# Patient Record
Sex: Male | Born: 2000 | Race: White | Hispanic: No | Marital: Single
Health system: Southern US, Community
[De-identification: ages and names within clinical notes are randomized; demographics above are authoritative.]

---

## 2019-12-19 ENCOUNTER — Ambulatory Visit: Payer: Self-pay | Attending: Internal Medicine

## 2019-12-27 ENCOUNTER — Ambulatory Visit: Payer: Self-pay | Attending: Internal Medicine

## 2019-12-27 DIAGNOSIS — Z23 Encounter for immunization: Secondary | ICD-10-CM

## 2019-12-27 NOTE — Progress Notes (Signed)
   Covid-19 Vaccination Clinic  Name:  Henry Rodgers    MRN: [id number] DOB: 29-Dec-2000  12/27/2019  [name] was observed post Covid-19 immunization for 15 minutes without incident. He was provided with Vaccine Information Sheet and instruction to access the V-Safe system.   [name] was instructed to call 911 with any severe reactions post vaccine: Marland Kitchen Difficulty breathing  . Swelling of face and throat  . A fast heartbeat  . A bad rash all over body  . Dizziness and weakness   Immunizations Administered    Name Date Dose VIS Date Route   Pfizer COVID-19 Vaccine 12/27/2019  5:42 PM 0.3 mL 09/10/2019 Intramuscular   Manufacturer: [employer], [employer]   Lot: [id number]   NDC: [address]-0

## 2020-01-17 ENCOUNTER — Ambulatory Visit: Payer: Self-pay | Attending: Internal Medicine

## 2020-01-17 DIAGNOSIS — Z23 Encounter for immunization: Secondary | ICD-10-CM

## 2020-01-17 NOTE — Progress Notes (Signed)
   Covid-19 Vaccination Clinic  Name:  Henry Rodgers    MRN: [id number] DOB: October 14, 2000  01/17/2020  Mr. Henry Rodgers was observed post Covid-19 immunization for 30 minutes based on pre-vaccination screening without incident. He was provided with Vaccine Information Sheet and instruction to access the V-Safe system.   Mr. Henry Rodgers was instructed to call 911 with any severe reactions post vaccine: Marland Kitchen Difficulty breathing  . Swelling of face and throat  . A fast heartbeat  . A bad rash all over body  . Dizziness and weakness   Immunizations Administered    Name Date Dose VIS Date Route   Pfizer COVID-19 Vaccine 01/17/2020  5:18 PM 0.3 mL 11/24/2018 Intramuscular   Manufacturer: [employer], [employer]   Lot: [id number]   NDC: [address]-7

## 2021-02-14 ENCOUNTER — Other Ambulatory Visit: Payer: Self-pay

## 2021-02-14 ENCOUNTER — Emergency Department
Admission: EM | Admit: 2021-02-14 | Discharge: 2021-02-14 | Disposition: A | Discharge status: Home / Self Care | Payer: BLUE CROSS/BLUE SHIELD | Attending: Emergency Medicine | Admitting: Emergency Medicine

## 2021-02-14 ENCOUNTER — Encounter: Payer: Self-pay | Admitting: Emergency Medicine

## 2021-02-14 ENCOUNTER — Emergency Department: Payer: BLUE CROSS/BLUE SHIELD

## 2021-02-14 DIAGNOSIS — F10129 Alcohol abuse with intoxication, unspecified: Secondary | ICD-10-CM | POA: Diagnosis not present

## 2021-02-14 DIAGNOSIS — R4182 Altered mental status, unspecified: Secondary | ICD-10-CM | POA: Diagnosis not present

## 2021-02-14 DIAGNOSIS — Y908 Blood alcohol level of 240 mg/100 ml or more: Secondary | ICD-10-CM | POA: Insufficient documentation

## 2021-02-14 DIAGNOSIS — S80211A Abrasion, right knee, initial encounter: Secondary | ICD-10-CM | POA: Insufficient documentation

## 2021-02-14 DIAGNOSIS — Z91010 Allergy to peanuts: Secondary | ICD-10-CM | POA: Insufficient documentation

## 2021-02-14 DIAGNOSIS — Y92168 Other place in school dormitory as the place of occurrence of the external cause: Secondary | ICD-10-CM | POA: Insufficient documentation

## 2021-02-14 DIAGNOSIS — S6992XA Unspecified injury of left wrist, hand and finger(s), initial encounter: Secondary | ICD-10-CM | POA: Diagnosis present

## 2021-02-14 DIAGNOSIS — S60512A Abrasion of left hand, initial encounter: Secondary | ICD-10-CM | POA: Diagnosis not present

## 2021-02-14 DIAGNOSIS — F10920 Alcohol use, unspecified with intoxication, uncomplicated: Secondary | ICD-10-CM

## 2021-02-14 LAB — CBC WITH DIFFERENTIAL/PLATELET
Abs Immature Granulocytes: 0.01 10*3/uL (ref 0.00–0.07)
Basophils Absolute: 0.1 10*3/uL (ref 0.0–0.1)
Basophils Relative: 1 %
Eosinophils Absolute: 0.3 10*3/uL (ref 0.0–0.5)
Eosinophils Relative: 4 %
HCT: 45.6 % (ref 39.0–52.0)
Hemoglobin: 15.6 g/dL (ref 13.0–17.0)
Immature Granulocytes: 0 %
Lymphocytes Relative: 34 %
Lymphs Abs: 2.8 10*3/uL (ref 0.7–4.0)
MCH: 30.1 pg (ref 26.0–34.0)
MCHC: 34.2 g/dL (ref 30.0–36.0)
MCV: 88 fL (ref 80.0–100.0)
Monocytes Absolute: 0.6 10*3/uL (ref 0.1–1.0)
Monocytes Relative: 7 %
Neutro Abs: 4.4 10*3/uL (ref 1.7–7.7)
Neutrophils Relative %: 54 %
Platelets: 276 10*3/uL (ref 150–400)
RBC: 5.18 MIL/uL (ref 4.22–5.81)
RDW: 12.3 % (ref 11.5–15.5)
WBC: 8.1 10*3/uL (ref 4.0–10.5)
nRBC: 0 % (ref 0.0–0.2)

## 2021-02-14 LAB — URINALYSIS, COMPLETE (UACMP) WITH MICROSCOPIC
Bacteria, UA: NONE SEEN
Bilirubin Urine: NEGATIVE
Glucose, UA: NEGATIVE mg/dL
Hgb urine dipstick: NEGATIVE
Ketones, ur: NEGATIVE mg/dL
Leukocytes,Ua: NEGATIVE
Nitrite: NEGATIVE
Protein, ur: NEGATIVE mg/dL
Specific Gravity, Urine: 1.002 — ABNORMAL LOW (ref 1.005–1.030)
Squamous Epithelial / HPF: NONE SEEN (ref 0–5)
pH: 7 (ref 5.0–8.0)

## 2021-02-14 LAB — COMPREHENSIVE METABOLIC PANEL
ALT: 13 U/L (ref 0–44)
AST: 24 U/L (ref 15–41)
Albumin: 4.6 g/dL (ref 3.5–5.0)
Alkaline Phosphatase: 54 U/L (ref 38–126)
Anion gap: 13 (ref 5–15)
BUN: 12 mg/dL (ref 6–20)
CO2: 22 mmol/L (ref 22–32)
Calcium: 9 mg/dL (ref 8.9–10.3)
Chloride: 106 mmol/L (ref 98–111)
Creatinine, Ser: 0.85 mg/dL (ref 0.61–1.24)
GFR, Estimated: >60 mL/min (ref >60)
Glucose, Bld: 125 mg/dL — ABNORMAL HIGH (ref 70–99)
Potassium: 3.8 mmol/L (ref 3.5–5.1)
Sodium: 141 mmol/L (ref 135–145)
Total Bilirubin: 0.6 mg/dL (ref 0.3–1.2)
Total Protein: 8.2 g/dL — ABNORMAL HIGH (ref 6.5–8.1)

## 2021-02-14 LAB — URINE DRUG SCREEN, QUALITATIVE (ARMC ONLY)
Amphetamines, Ur Screen: NOT DETECTED
Barbiturates, Ur Screen: NOT DETECTED
Benzodiazepine, Ur Scrn: NOT DETECTED
Cannabinoid 50 Ng, Ur ~~LOC~~: NOT DETECTED
Cocaine Metabolite,Ur ~~LOC~~: NOT DETECTED
MDMA (Ecstasy)Ur Screen: NOT DETECTED
Methadone Scn, Ur: NOT DETECTED
Opiate, Ur Screen: NOT DETECTED
Phencyclidine (PCP) Ur S: NOT DETECTED
Tricyclic, Ur Screen: NOT DETECTED

## 2021-02-14 LAB — ETHANOL: Alcohol, Ethyl (B): 324 mg/dL (ref <10)

## 2021-02-14 MED ORDER — SODIUM CHLORIDE 0.9 % IV BOLUS
1000.0000 mL | Freq: Once | INTRAVENOUS | Status: AC
  Administered 2021-02-14: 1000 mL via INTRAVENOUS

## 2021-02-14 NOTE — ED Notes (Signed)
Pt presents with abrasions on R knee, L hand, and R elbow. Pt unaware of how he got them.
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
## 2021-02-14 NOTE — ED Notes (Signed)
NAD noted at time of D/C. Pt ambulatory with steady gait to the lobby to wait for his uber. Pt denies comments/concerns regarding D/C instructions. Per EDP pt okay to go home in [place]. Pt removed all monitoring equipment and IV prior to this RN arrival to bedside with D/C papers. Verbal consent for D/C obtained at this time.

## 2021-02-14 NOTE — ED Notes (Signed)
Family updated as to patient's status.
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
## 2021-02-14 NOTE — ED Notes (Signed)
Pt awakens easily with mild stimulation. Pt calling friends for a ride on personal cell phone at this time.

## 2021-02-14 NOTE — ED Notes (Signed)
Pt's mother updated on pt condition. Pt given phone to speak to mother per pt request.

## 2021-02-14 NOTE — ED Notes (Signed)
This RN to bedside, introduced self to patient. Pt visualized in NAD, in bed and speaking on personal cell phone. Call bell within reach of patient at this time.

## 2021-06-11 ENCOUNTER — Emergency Department
Admission: EM | Admit: 2021-06-11 | Discharge: 2021-06-11 | Disposition: A | Discharge status: Home / Self Care | Payer: BLUE CROSS/BLUE SHIELD | Attending: Student in an Organized Health Care Education/Training Program | Admitting: Student in an Organized Health Care Education/Training Program

## 2021-06-11 ENCOUNTER — Emergency Department: Payer: BLUE CROSS/BLUE SHIELD

## 2021-06-11 ENCOUNTER — Other Ambulatory Visit: Payer: Self-pay

## 2021-06-11 DIAGNOSIS — N50812 Left testicular pain: Secondary | ICD-10-CM | POA: Diagnosis not present

## 2021-06-11 DIAGNOSIS — R52 Pain, unspecified: Secondary | ICD-10-CM

## 2021-06-11 DIAGNOSIS — Z91010 Allergy to peanuts: Secondary | ICD-10-CM | POA: Insufficient documentation

## 2021-06-11 DIAGNOSIS — R1032 Left lower quadrant pain: Secondary | ICD-10-CM | POA: Diagnosis not present

## 2021-06-11 LAB — URINALYSIS, COMPLETE (UACMP) WITH MICROSCOPIC
Bacteria, UA: NONE SEEN
Bilirubin Urine: NEGATIVE
Glucose, UA: NEGATIVE mg/dL
Hgb urine dipstick: NEGATIVE
Ketones, ur: NEGATIVE mg/dL
Leukocytes,Ua: NEGATIVE
Nitrite: NEGATIVE
Protein, ur: NEGATIVE mg/dL
Specific Gravity, Urine: 1.015 (ref 1.005–1.030)
pH: 5 (ref 5.0–8.0)

## 2021-06-11 LAB — CBC
HCT: 41.3 % (ref 39.0–52.0)
Hemoglobin: 14.2 g/dL (ref 13.0–17.0)
MCH: 31.2 pg (ref 26.0–34.0)
MCHC: 34.4 g/dL (ref 30.0–36.0)
MCV: 90.8 fL (ref 80.0–100.0)
Platelets: 258 10*3/uL (ref 150–400)
RBC: 4.55 MIL/uL (ref 4.22–5.81)
RDW: 12.4 % (ref 11.5–15.5)
WBC: 8.4 10*3/uL (ref 4.0–10.5)
nRBC: 0 % (ref 0.0–0.2)

## 2021-06-11 LAB — BASIC METABOLIC PANEL
Anion gap: 8 (ref 5–15)
BUN: 19 mg/dL (ref 6–20)
CO2: 27 mmol/L (ref 22–32)
Calcium: 9.2 mg/dL (ref 8.9–10.3)
Chloride: 103 mmol/L (ref 98–111)
Creatinine, Ser: 0.92 mg/dL (ref 0.61–1.24)
GFR, Estimated: >60 mL/min (ref >60)
Glucose, Bld: 103 mg/dL — ABNORMAL HIGH (ref 70–99)
Potassium: 3.9 mmol/L (ref 3.5–5.1)
Sodium: 138 mmol/L (ref 135–145)

## 2021-06-11 MED ORDER — KETOROLAC TROMETHAMINE 30 MG/ML IJ SOLN
30.0000 mg | Freq: Once | INTRAMUSCULAR | Status: AC
  Administered 2021-06-11: 30 mg via INTRAMUSCULAR
  Filled 2021-06-11: qty 1

## 2021-06-11 NOTE — Discharge Instructions (Addendum)
Please take alleve or motrin for pain.  Please follow up with PCP.   Return for worsening pain, fevers or any other questions or concerns.

## 2021-06-11 NOTE — ED Triage Notes (Signed)
Pt sent from [facility] with c/o left testicular pain, r/o torsion

## 2021-06-11 NOTE — ED Provider Notes (Signed)
[facility] Emergency Department Provider Note    Event Date/Time   First MD Initiated Contact with Patient 06/11/21 1915     (approximate)  I have reviewed the triage vital signs and the nursing notes.   HISTORY  Chief Complaint Testicle Pain    HPI Henry Rodgers is a 20 y.o. male who presents to the ER for evaluation of intermittent but sudden onset left testicular pain that occurred around noon this morning.  Denies any trauma.  Denies any fevers.  States that pain spontaneously resolved has had a intermittent throbbing and aching pain since then denies any dysuria.  Does have family history of kidney stones.  History reviewed. No pertinent past medical history. No family history on file. History reviewed. No pertinent surgical history. There are no problems to display for this patient.     Prior to Admission medications   Not on File    Allergies Peanut-containing drug products, Tree extract, and Omnicef [cefdinir]    Social History    Review of Systems Patient denies headaches, rhinorrhea, blurry vision, numbness, shortness of breath, chest pain, edema, cough, abdominal pain, nausea, vomiting, diarrhea, dysuria, fevers, rashes or hallucinations unless otherwise stated above in HPI. ____________________________________________   PHYSICAL EXAM:  VITAL SIGNS: Vitals:   06/11/21 1717  BP: 119/74  Pulse: (!) 59  Resp: 18  Temp: 99.1 F (37.3 C)  SpO2: 100%    Constitutional: Alert and oriented.  Eyes: Conjunctivae are normal.  Head: Atraumatic. Nose: No congestion/rhinnorhea. Mouth/Throat: Mucous membranes are moist.   Neck: No stridor. Painless ROM.  Cardiovascular: Normal rate, regular rhythm. Grossly normal heart sounds.  Good peripheral circulation. Respiratory: Normal respiratory effort.  No retractions. Lungs CTAB. Gastrointestinal: Soft and nontender. No distention. No abdominal bruits. No CVA  tenderness. Genitourinary: Normal-appearing external genitalia.  Scrotum is benign appearing, no masses, no overlying erythema Musculoskeletal: No lower extremity tenderness nor edema.  No joint effusions. Neurologic:  Normal speech and language. No gross focal neurologic deficits are appreciated. No facial droop Skin:  Skin is warm, dry and intact. No rash noted. Psychiatric: Mood and affect are normal. Speech and behavior are normal.  ____________________________________________   LABS (all labs ordered are listed, but only abnormal results are displayed)  Results for orders placed or performed during the hospital encounter of 06/11/21 (from the past 24 hour(s))  CBC     Status: None   Collection Time: 06/11/21  5:18 PM  Result Value Ref Range   WBC 8.4 4.0 - 10.5 K/uL   RBC 4.55 4.22 - 5.81 MIL/uL   Hemoglobin 14.2 13.0 - 17.0 g/dL   HCT 08.1 44.8 - 18.5 %   MCV 90.8 80.0 - 100.0 fL   MCH 31.2 26.0 - 34.0 pg   MCHC 34.4 30.0 - 36.0 g/dL   RDW 63.1 49.7 - 02.6 %   Platelets 258 150 - 400 K/uL   nRBC 0.0 0.0 - 0.2 %  Basic metabolic panel     Status: Abnormal   Collection Time: 06/11/21  5:18 PM  Result Value Ref Range   Sodium 138 135 - 145 mmol/L   Potassium 3.9 3.5 - 5.1 mmol/L   Chloride 103 98 - 111 mmol/L   CO2 27 22 - 32 mmol/L   Glucose, Bld 103 (H) 70 - 99 mg/dL   BUN 19 6 - 20 mg/dL   Creatinine, Ser 3.78 0.61 - 1.24 mg/dL   Calcium 9.2 8.9 - 58.8 mg/dL   GFR, Estimated >  60 >60 mL/min   Anion gap 8 5 - 15  Urinalysis, Complete w Microscopic     Status: Abnormal   Collection Time: 06/11/21  8:25 PM  Result Value Ref Range   Color, Urine STRAW (A) YELLOW   APPearance CLEAR (A) CLEAR   Specific Gravity, Urine 1.015 1.005 - 1.030   pH 5.0 5.0 - 8.0   Glucose, UA NEGATIVE NEGATIVE mg/dL   Hgb urine dipstick NEGATIVE NEGATIVE   Bilirubin Urine NEGATIVE NEGATIVE   Ketones, ur NEGATIVE NEGATIVE mg/dL   Protein, ur NEGATIVE NEGATIVE mg/dL   Nitrite NEGATIVE  NEGATIVE   Leukocytes,Ua NEGATIVE NEGATIVE   WBC, UA 0-5 0 - 5 WBC/hpf   Bacteria, UA NONE SEEN NONE SEEN   Squamous Epithelial / LPF 0-5 0 - 5   Mucus PRESENT    ____________________________________________ ____________________________________________  RADIOLOGY  I personally reviewed all radiographic images ordered to evaluate for the above acute complaints and reviewed radiology reports and findings.  These findings were personally discussed with the patient.  Please see medical record for radiology report.  ____________________________________________   PROCEDURES  Procedure(s) performed:  Procedures    Critical Care performed: no ____________________________________________   INITIAL IMPRESSION / ASSESSMENT AND PLAN / ED COURSE  Pertinent labs & imaging results that were available during my care of the patient were reviewed by me and considered in my medical decision making (see chart for details).   DDX: epididymitis, torsion, uti, stone, orchitis, hernia  Henry Rodgers is a 20 y.o. who presents to the ED with presentation as described above.  Patient presenting with left groin and testicular pain.  Not consistent with torsion no findings to suggest orchitis or epididymitis.  No scrotal cellulitis.  Blood work is reassuring.  Given the intermittent nature of pain and family history of stones CT imaging was ordered to evaluate for ureterolithiasis.  CT imaging is reassuring without any sign of mass, hernia or other cause for the patient's discomfort.  He is well-appearing in no acute distress.  States that he has been lifting weights may have developed some musculoskeletal pain or irritation does appear stable and appropriate for outpatient follow-up.  We discussed signs and symptoms for which she should return to the ER.     The patient was evaluated in Emergency Department today for the symptoms described in the history of present illness. He/she was evaluated in the  context of the global COVID-19 pandemic, which necessitated consideration that the patient might be at risk for infection with the SARS-CoV-2 virus that causes COVID-19. Institutional protocols and algorithms that pertain to the evaluation of patients at risk for COVID-19 are in a state of rapid change based on information released by regulatory bodies including the CDC and federal and state organizations. These policies and algorithms were followed during the patient's care in the ED.  As part of my medical decision making, I reviewed the following data within the electronic MEDICAL RECORD NUMBER Nursing notes reviewed and incorporated, Labs reviewed, notes from prior ED visits and [place] Controlled Substance Database   ____________________________________________   FINAL CLINICAL IMPRESSION(S) / ED DIAGNOSES  Final diagnoses:  Pain  Pain in left testicle      NEW MEDICATIONS STARTED DURING THIS VISIT:  New Prescriptions   No medications on file     Note:  This document was prepared using Dragon voice recognition software and may include unintentional dictation errors.    Willy Eddy, MD 06/11/21 2048

## 2022-04-01 IMAGING — CT CT HEAD W/O CM
3 series · 16 of 47 positions shown, 19 images · non-contrast
Comparison: None.

CLINICAL DATA: Encephalopathy.  Alcohol intoxication.

EXAM:
CT HEAD WITHOUT CONTRAST
TECHNIQUE: Contiguous axial images were obtained from the base of the skull
through the vertex without intravenous contrast.

[Series 3: head wo · axial · 0.46mm/px · z∈[-135,+5]mm · 10 of 34 slices shown, 13 images]
[im 3/34  brain]
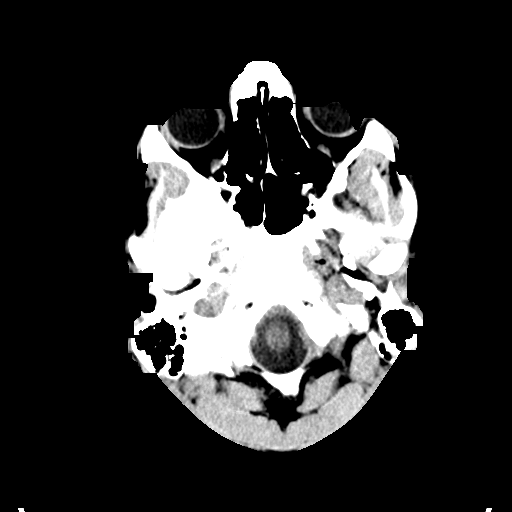
[im 3/34  bone]
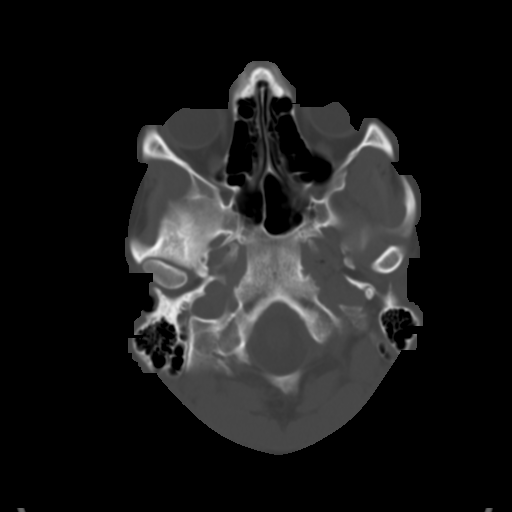
[im 6/34  brain]
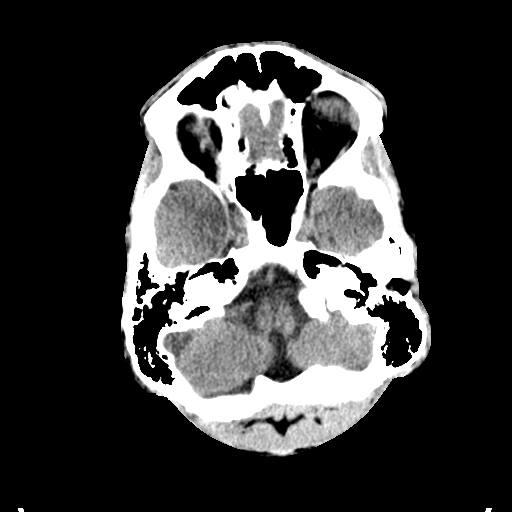
[im 10/34  brain]
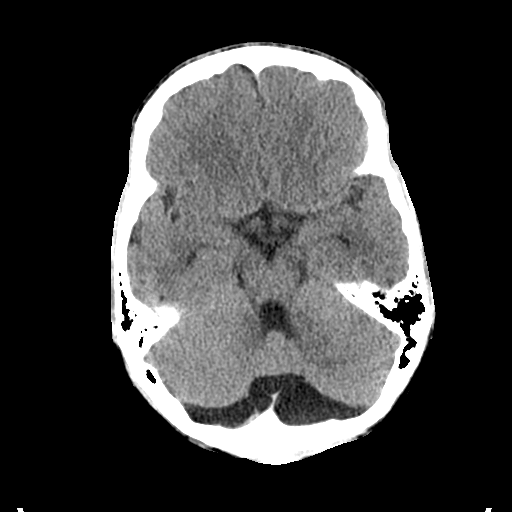
[im 12/34  brain]
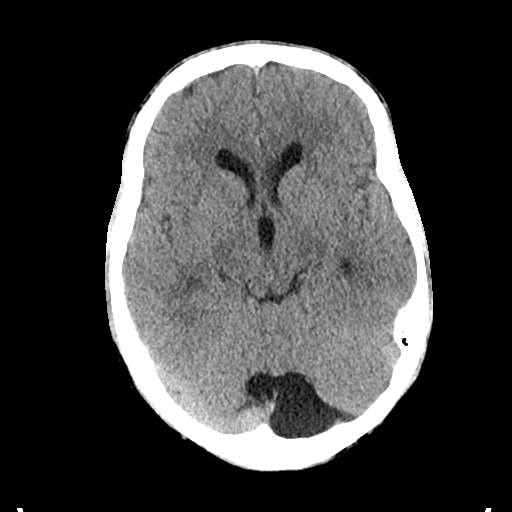
[im 15/34  brain]
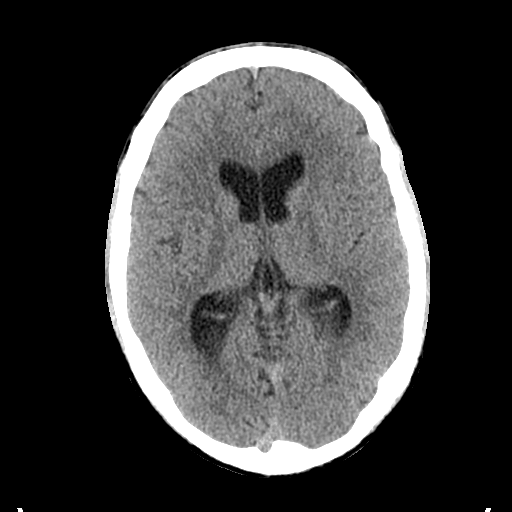
[im 15/34  bone]
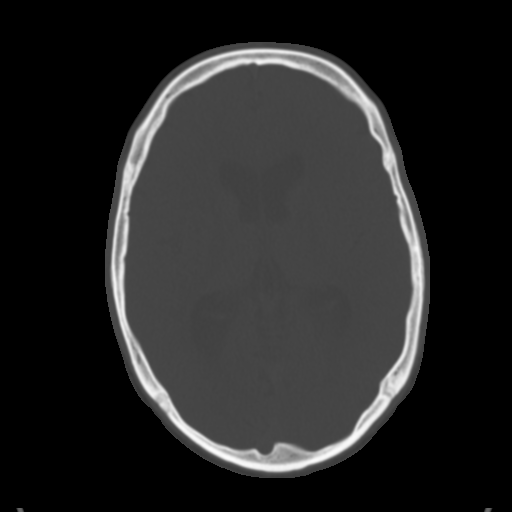
[im 19/34  brain]
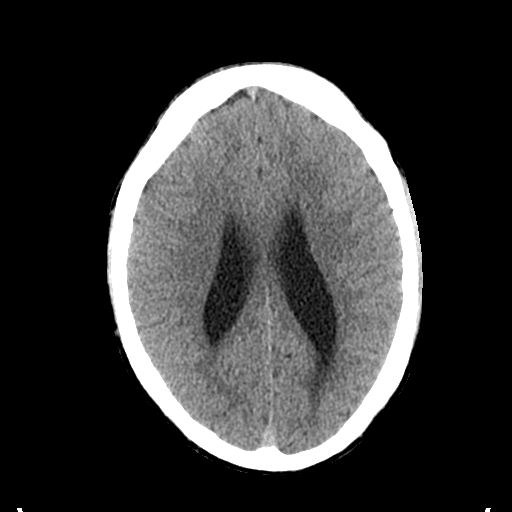
[im 22/34  brain]
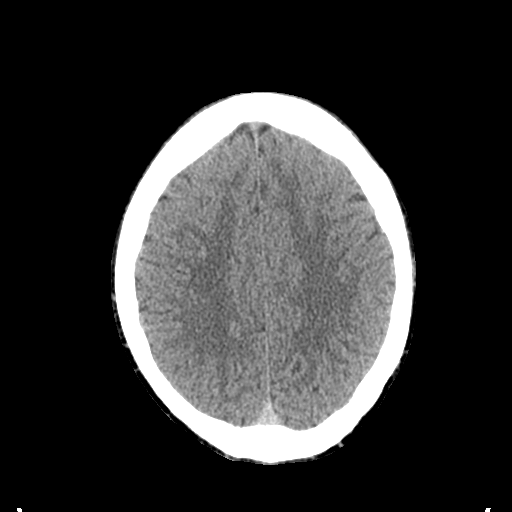
[im 26/34  brain]
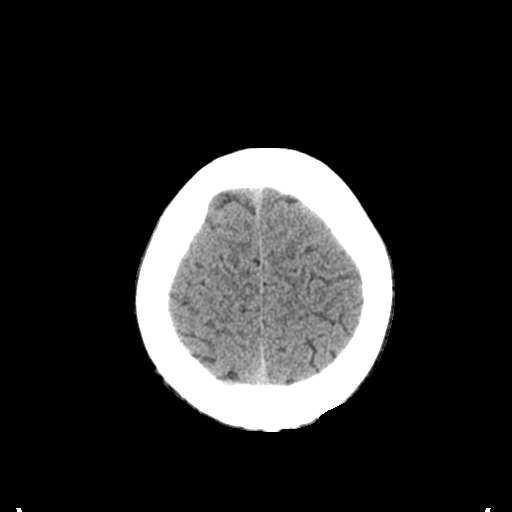
[im 28/34  brain]
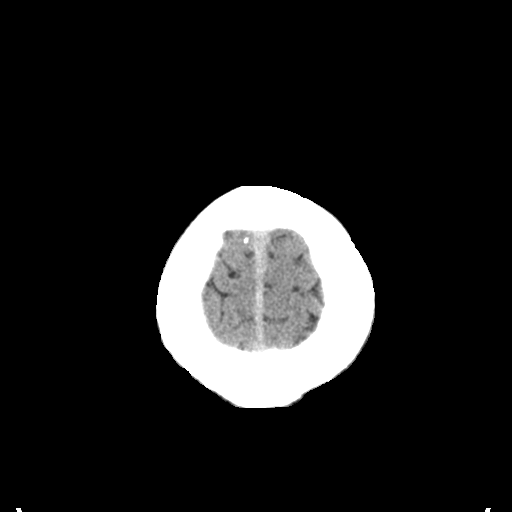
[im 28/34  bone]
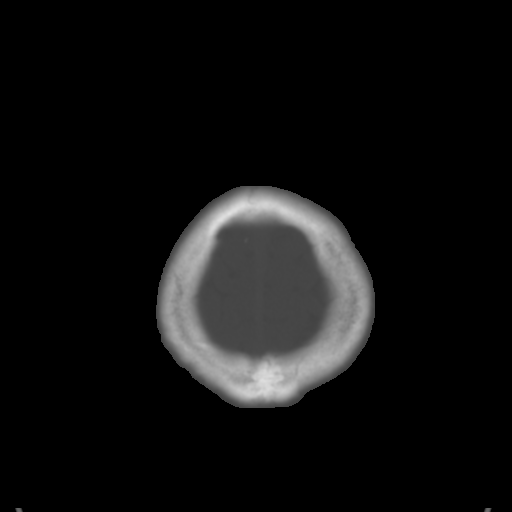
[im 31/34  brain]
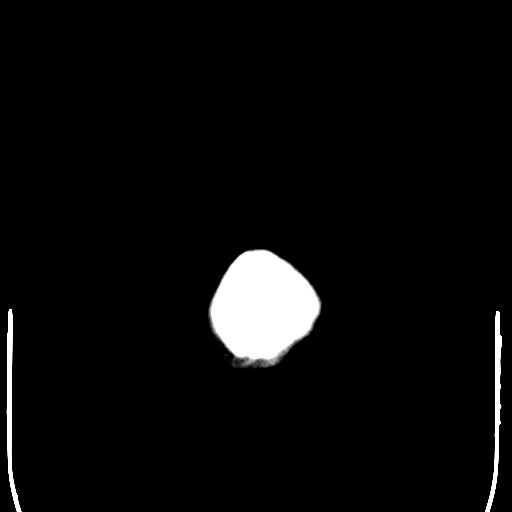

[Series 4: coronal soft tissue · coronal · 0.35mm/px · 3 of 72 slices shown]
[im 24/72  brain]
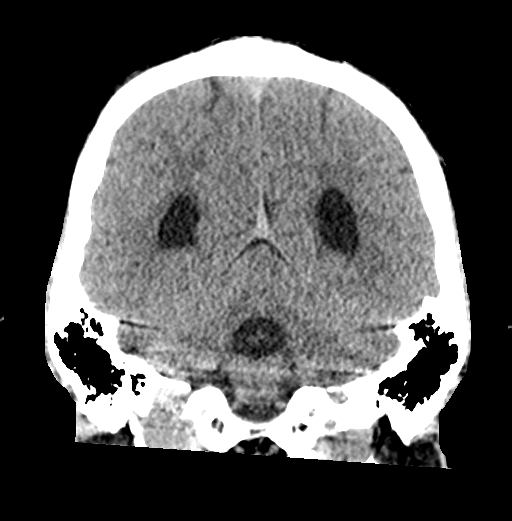
[im 32/72  brain]
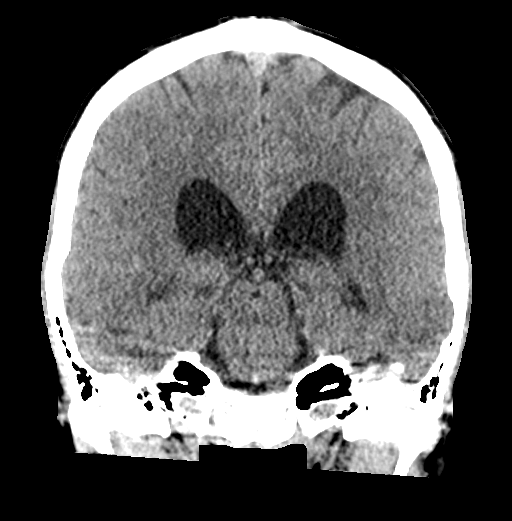
[im 40/72  brain]
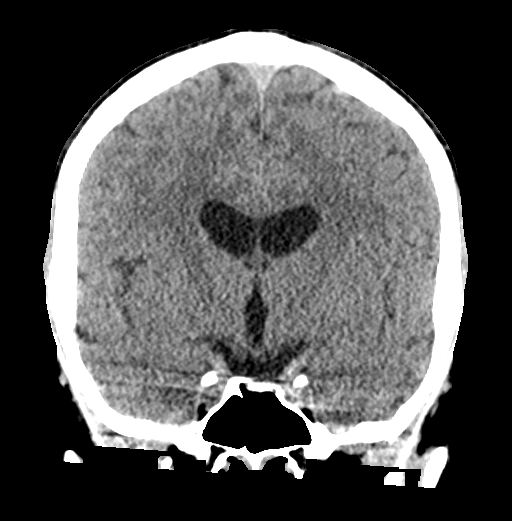

[Series 5: sagittal soft tissue · sagittal · 0.35mm/px · 3 of 60 slices shown]
[im 20/60  brain]
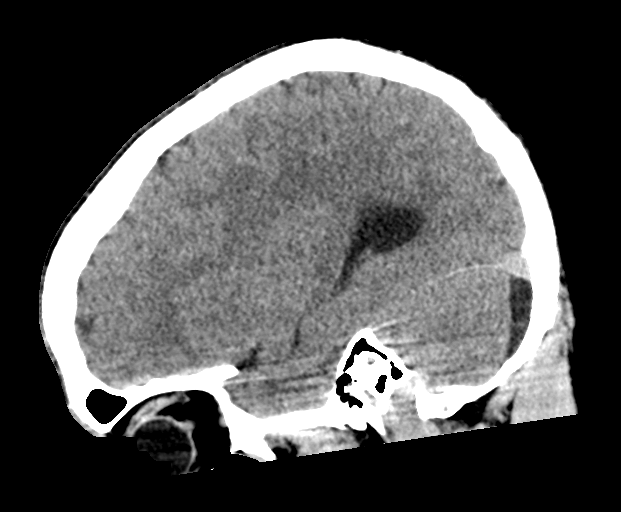
[im 30/60  brain]
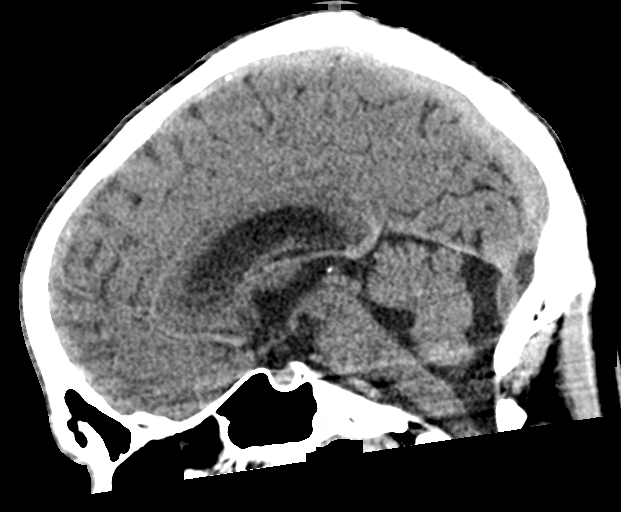
[im 40/60  brain]
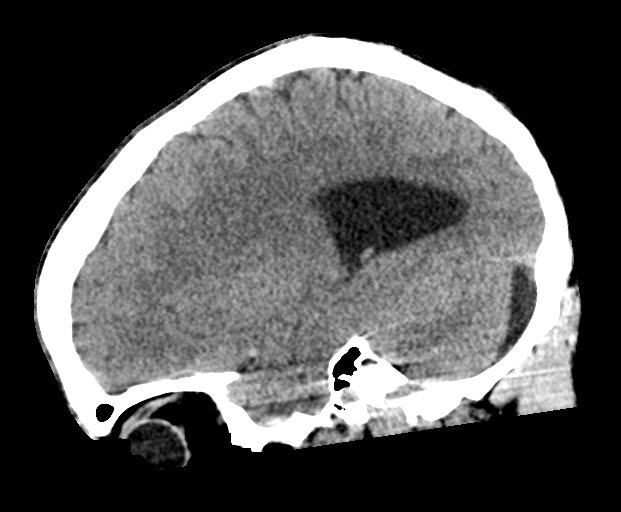

[16 of 47 positions shown; findings below may reference images not displayed]

FINDINGS: Brain: There is no mass, hemorrhage or extra-axial collection. The
size and configuration of the ventricles and extra-axial CSF spaces
are normal. The brain parenchyma is normal, without acute or chronic
infarction. Mega cisterna magna.

Vascular: No abnormal hyperdensity of the major intracranial
arteries or dural venous sinuses. No intracranial atherosclerosis.

Skull: The visualized skull base, calvarium and extracranial soft
tissues are normal.

Sinuses/Orbits: No fluid levels or advanced mucosal thickening of
the visualized paranasal sinuses. No mastoid or middle ear effusion.
The orbits are normal.
IMPRESSION: Normal head CT.

## 2022-07-27 IMAGING — CT CT RENAL STONE PROTOCOL
2 of 4 series · 16 of 46 positions shown, 18 images · non-contrast
Comparison: None.

CLINICAL DATA: Left lower quadrant pain

EXAM:
CT ABDOMEN AND PELVIS WITHOUT CONTRAST
TECHNIQUE: Multidetector CT imaging of the abdomen and pelvis was performed
following the standard protocol without IV contrast.

[Series 2: stone full standard · axial · 0.66mm/px · z∈[-555,-120]mm · 13 of 97 slices shown, 15 images]
[im 5/97  soft-tissue]
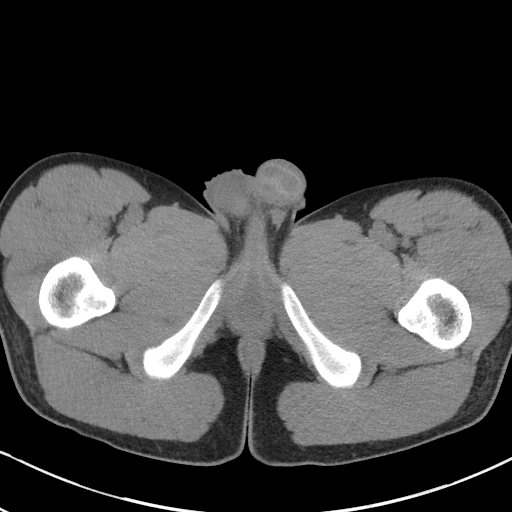
[im 5/97  bone]
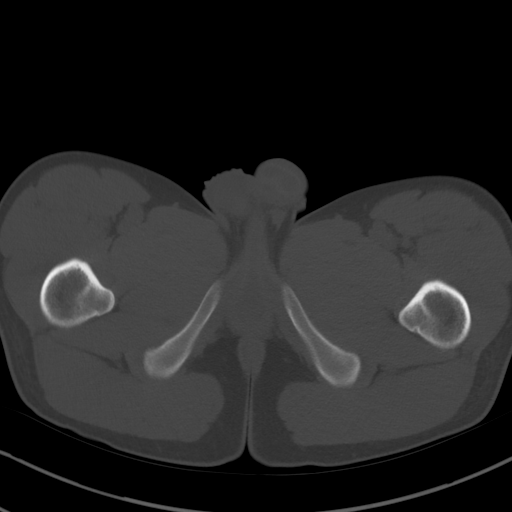
[im 13/97  soft-tissue]
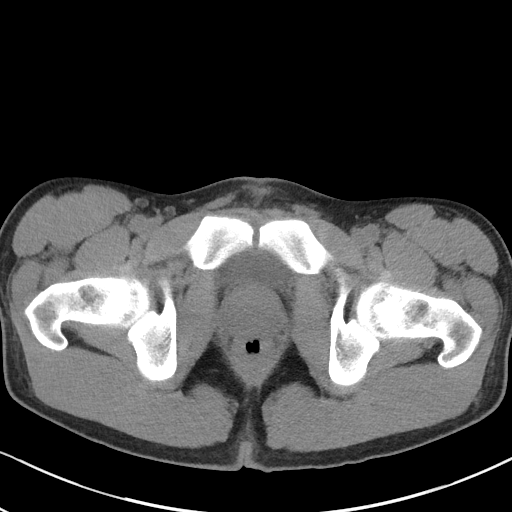
[im 21/97  soft-tissue]
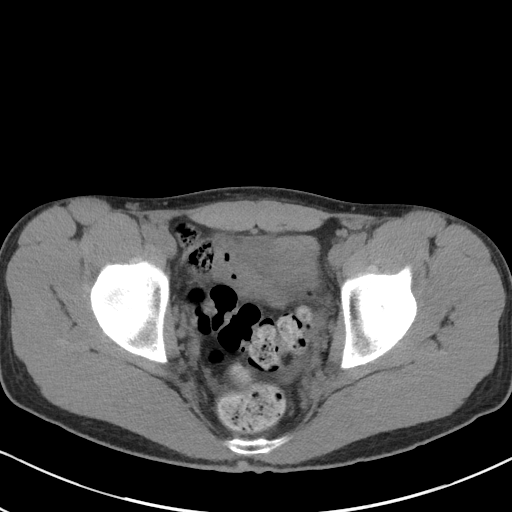
[im 26/97  soft-tissue]
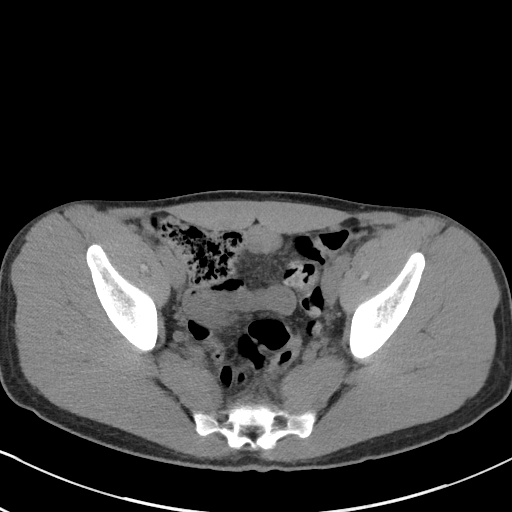
[im 34/97  soft-tissue]
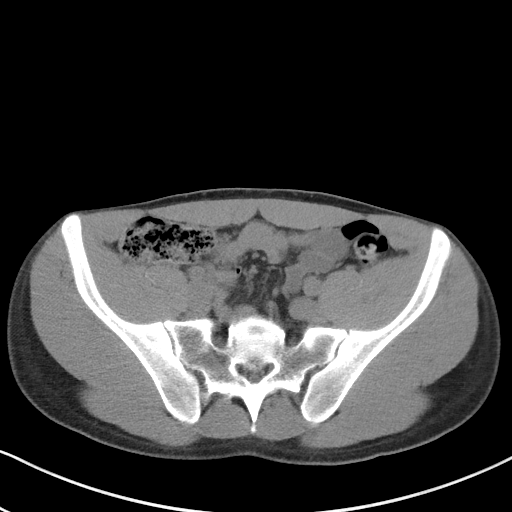
[im 42/97  soft-tissue]
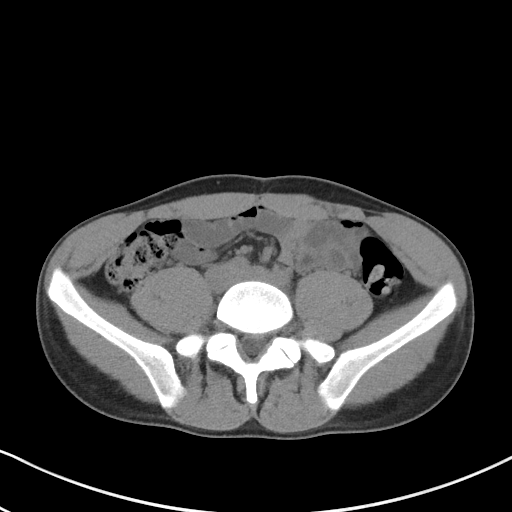
[im 51/97  soft-tissue]
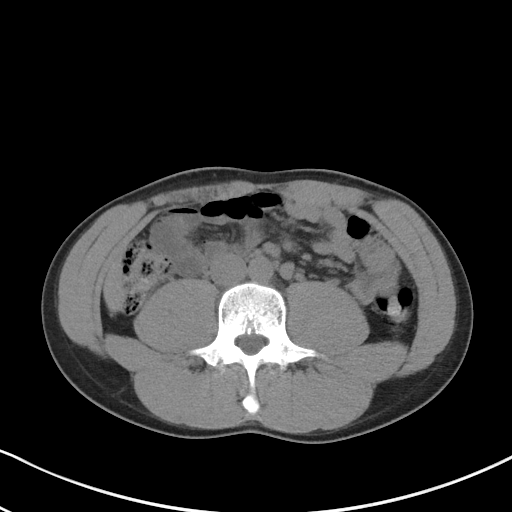
[im 55/97  soft-tissue]
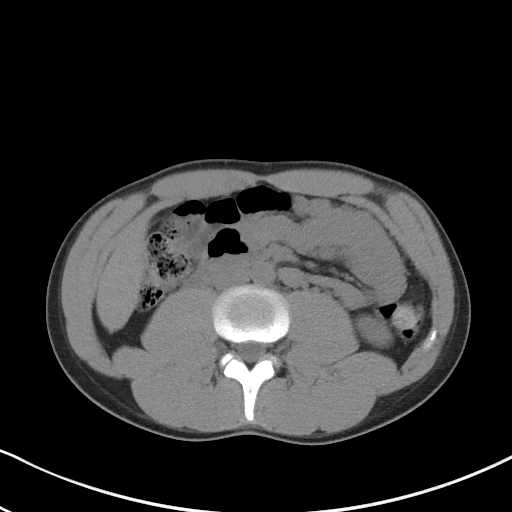
[im 63/97  soft-tissue]
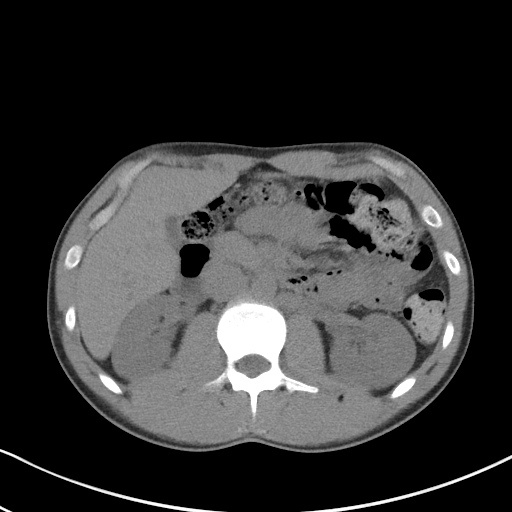
[im 63/97  bone]
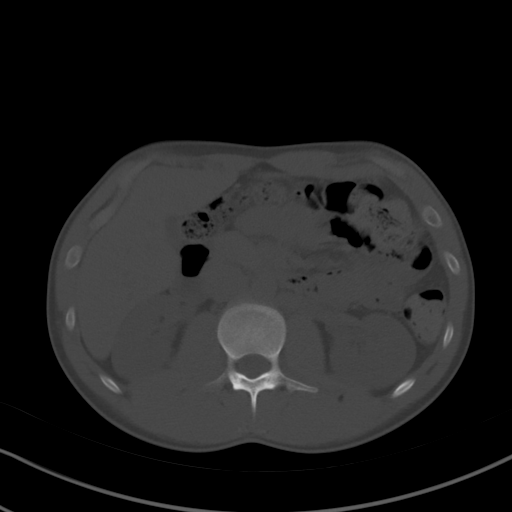
[im 71/97  soft-tissue]
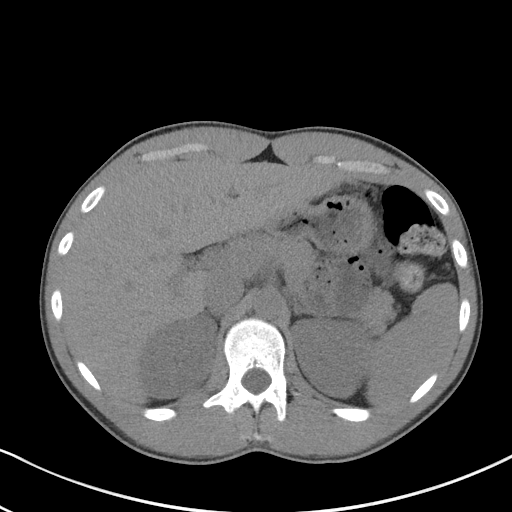
[im 76/97  soft-tissue]
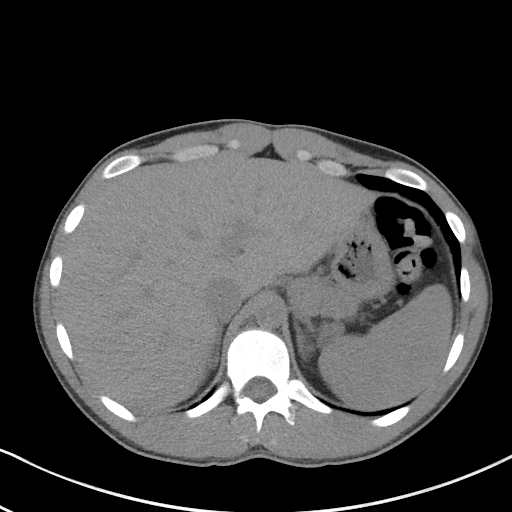
[im 84/97  soft-tissue]
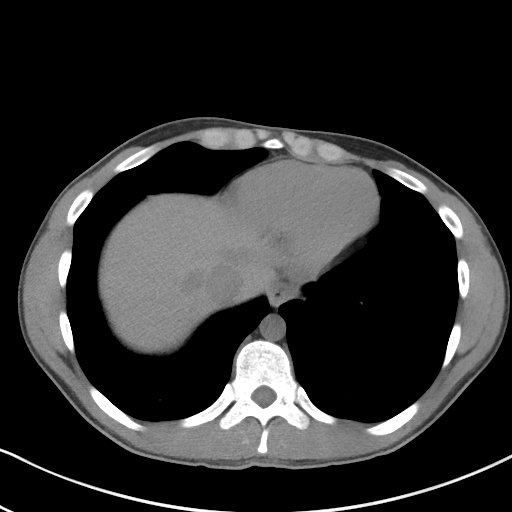
[im 92/97  soft-tissue]
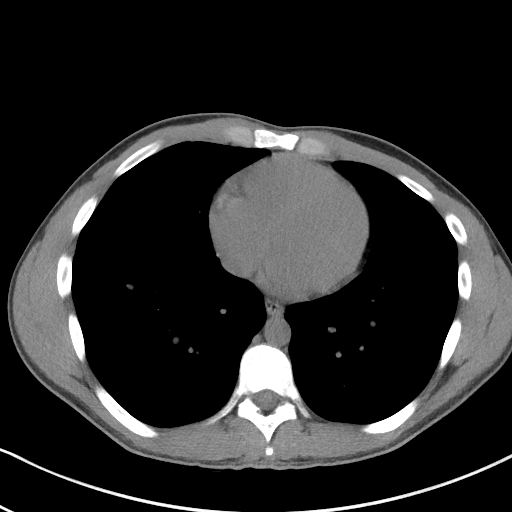

[Series 5: coronal · coronal · 0.71mm/px · 3 of 121 slices shown]
[im 41/121  soft-tissue]
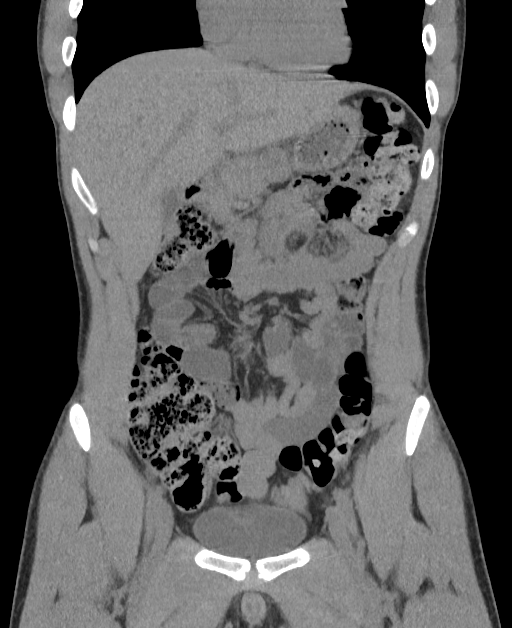
[im 54/121  soft-tissue]
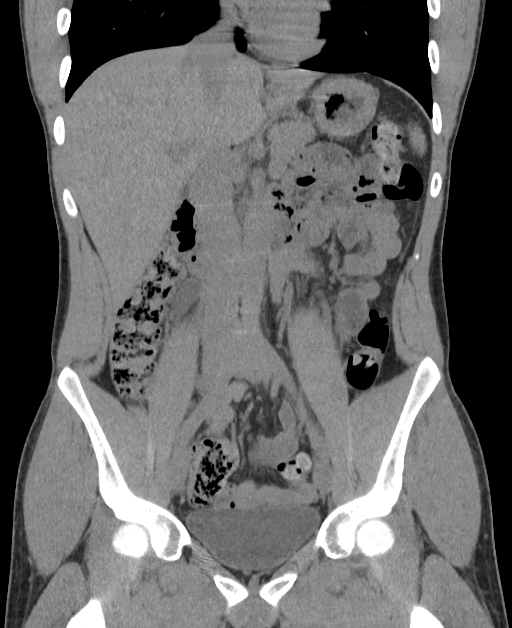
[im 67/121  soft-tissue]
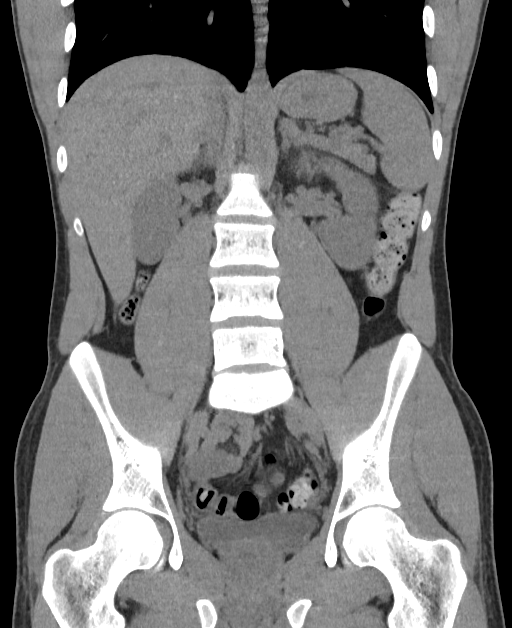

[16 of 46 positions shown; findings below may reference images not displayed]

FINDINGS: Lower chest: Lung bases are clear.

Hepatobiliary: Unenhanced liver is unremarkable.

Gallbladder is underdistended. No intrahepatic or extrahepatic
ductal dilatation.

Pancreas: Within normal limits.

Spleen: Within normal limits.

Adrenals/Urinary Tract: Adrenal glands are within normal limits.

Kidneys are within normal limits. No renal, ureteral, or bladder
calculi. No hydronephrosis.

Bladder is within normal limits.

Stomach/Bowel: Stomach is within normal limits.

No evidence of bowel obstruction.

Normal appendix (series 2/image 64).

No colonic wall thickening or inflammatory changes. Mild to moderate
left colonic stool burden.

Vascular/Lymphatic: No evidence of abdominal aortic aneurysm.

No suspicious abdominopelvic lymphadenopathy.

Reproductive: Prostate is unremarkable.

Other: No abdominopelvic ascites.

Musculoskeletal: Visualized osseous structures are within normal
limits.
IMPRESSION: Negative CT abdomen/pelvis.
